# Patient Record
Sex: Female | Born: 1995 | Race: Black or African American | Hispanic: No | Marital: Single
Health system: Southern US, Community
[De-identification: ages and names within clinical notes are randomized; demographics above are authoritative.]

---
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
## 2020-06-11 ENCOUNTER — Other Ambulatory Visit: Payer: Self-pay

## 2020-06-11 ENCOUNTER — Emergency Department
Admission: EM | Admit: 2020-06-11 | Discharge: 2020-06-11 | Disposition: A | Discharge status: Home / Self Care | Payer: No Typology Code available for payment source | Attending: Emergency Medicine | Admitting: Emergency Medicine

## 2020-06-11 ENCOUNTER — Emergency Department: Payer: No Typology Code available for payment source

## 2020-06-11 DIAGNOSIS — L03211 Cellulitis of face: Secondary | ICD-10-CM | POA: Insufficient documentation

## 2020-06-11 DIAGNOSIS — R220 Localized swelling, mass and lump, head: Secondary | ICD-10-CM | POA: Diagnosis present

## 2020-06-11 LAB — CBC
HCT: 44.2 % (ref 36.0–46.0)
Hemoglobin: 14.5 g/dL (ref 12.0–15.0)
MCH: 28.5 pg (ref 26.0–34.0)
MCHC: 32.8 g/dL (ref 30.0–36.0)
MCV: 86.8 fL (ref 80.0–100.0)
Platelets: 253 10*3/uL (ref 150–400)
RBC: 5.09 MIL/uL (ref 3.87–5.11)
RDW: 13 % (ref 11.5–15.5)
WBC: 15.1 10*3/uL — ABNORMAL HIGH (ref 4.0–10.5)
nRBC: 0 % (ref 0.0–0.2)

## 2020-06-11 LAB — BASIC METABOLIC PANEL
Anion gap: 11 (ref 5–15)
BUN: 10 mg/dL (ref 6–20)
CO2: 24 mmol/L (ref 22–32)
Calcium: 9 mg/dL (ref 8.9–10.3)
Chloride: 103 mmol/L (ref 98–111)
Creatinine, Ser: 1.28 mg/dL — ABNORMAL HIGH (ref 0.44–1.00)
GFR, Estimated: >60 mL/min (ref >60)
Glucose, Bld: 108 mg/dL — ABNORMAL HIGH (ref 70–99)
Potassium: 3.5 mmol/L (ref 3.5–5.1)
Sodium: 138 mmol/L (ref 135–145)

## 2020-06-11 MED ORDER — DOXYCYCLINE HYCLATE 50 MG PO CAPS: 100.0000 mg | ORAL_CAPSULE | Freq: Two times a day (BID) | ORAL | 0 refills | Status: AC

## 2020-06-11 MED ORDER — DOXYCYCLINE HYCLATE 100 MG PO TABS
100.0000 mg | ORAL_TABLET | Freq: Once | ORAL | Status: AC
  Administered 2020-06-11: 100 mg via ORAL
  Filled 2020-06-11: qty 1

## 2020-06-11 NOTE — Discharge Instructions (Signed)
Use ice packs over this right eye for 10 minutes on and 10 minutes off over the next 48 hours for any worsening swelling or pain.  After 48 hours you can continue to use hot packs.  Additionally, you may use NSAIDs or Tylenol for any continued pain.

## 2020-06-11 NOTE — ED Notes (Signed)
Patient to and from CT scan, denies itching/pain to face, moderate swelling noted to right side of face, patient denies any issues speaking or swallowing. Vitals stable, tachycardic.

## 2020-06-11 NOTE — ED Notes (Signed)
EKG completed and given to provider for review. Provider and primary RN aware of pts heart rate. Pt on cardiac monitoring.
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
## 2020-06-11 NOTE — ED Notes (Signed)
Pt given work note, pt requesting workman's comp for ED visit. Pt does not know what is required regarding drug/alcohol testing. Charge RN looking into if profile is on file for pt's job. Pt states she does not have information for contacting her supervisor and supervisor not present at this time. Pt informed she will need to attempt to contact employer tonight or tomorrow morning and may need to return for testing. Pt verbalized understanding.

## 2020-07-01 ENCOUNTER — Emergency Department (HOSPITAL_COMMUNITY)
Admission: EM | Admit: 2020-07-01 | Discharge: 2020-07-01 | Disposition: A | Discharge status: Home / Self Care | Payer: Medicaid Other | Attending: Emergency Medicine | Admitting: Emergency Medicine

## 2020-07-01 ENCOUNTER — Encounter (HOSPITAL_COMMUNITY): Payer: Self-pay | Admitting: Emergency Medicine

## 2020-07-01 ENCOUNTER — Other Ambulatory Visit: Payer: Self-pay

## 2020-07-01 DIAGNOSIS — R112 Nausea with vomiting, unspecified: Secondary | ICD-10-CM | POA: Diagnosis not present

## 2020-07-01 DIAGNOSIS — R110 Nausea: Secondary | ICD-10-CM

## 2020-07-01 LAB — COMPREHENSIVE METABOLIC PANEL
ALT: 21 U/L (ref 0–44)
AST: 18 U/L (ref 15–41)
Albumin: 3.9 g/dL (ref 3.5–5.0)
Alkaline Phosphatase: 75 U/L (ref 38–126)
Anion gap: 9 (ref 5–15)
BUN: 9 mg/dL (ref 6–20)
CO2: 22 mmol/L (ref 22–32)
Calcium: 9.1 mg/dL (ref 8.9–10.3)
Chloride: 108 mmol/L (ref 98–111)
Creatinine, Ser: 1.27 mg/dL — ABNORMAL HIGH (ref 0.44–1.00)
GFR, Estimated: >60 mL/min (ref >60)
Glucose, Bld: 115 mg/dL — ABNORMAL HIGH (ref 70–99)
Potassium: 4.3 mmol/L (ref 3.5–5.1)
Sodium: 139 mmol/L (ref 135–145)
Total Bilirubin: 0.7 mg/dL (ref 0.3–1.2)
Total Protein: 7.7 g/dL (ref 6.5–8.1)

## 2020-07-01 LAB — URINALYSIS, ROUTINE W REFLEX MICROSCOPIC
Bacteria, UA: NONE SEEN
Bilirubin Urine: NEGATIVE
Glucose, UA: NEGATIVE mg/dL
Ketones, ur: NEGATIVE mg/dL
Leukocytes,Ua: NEGATIVE
Nitrite: NEGATIVE
Protein, ur: NEGATIVE mg/dL
Specific Gravity, Urine: 1 — ABNORMAL LOW (ref 1.005–1.030)
pH: 5 (ref 5.0–8.0)

## 2020-07-01 LAB — I-STAT BETA HCG BLOOD, ED (MC, WL, AP ONLY): I-stat hCG, quantitative: <5 m[IU]/mL (ref <5)

## 2020-07-01 LAB — CBC
HCT: 49.1 % — ABNORMAL HIGH (ref 36.0–46.0)
Hemoglobin: 15.6 g/dL — ABNORMAL HIGH (ref 12.0–15.0)
MCH: 28.3 pg (ref 26.0–34.0)
MCHC: 31.8 g/dL (ref 30.0–36.0)
MCV: 89.1 fL (ref 80.0–100.0)
Platelets: 282 10*3/uL (ref 150–400)
RBC: 5.51 MIL/uL — ABNORMAL HIGH (ref 3.87–5.11)
RDW: 13 % (ref 11.5–15.5)
WBC: 17.7 10*3/uL — ABNORMAL HIGH (ref 4.0–10.5)
nRBC: 0 % (ref 0.0–0.2)

## 2020-07-01 LAB — LIPASE, BLOOD: Lipase: 29 U/L (ref 11–51)

## 2020-07-01 MED ORDER — ONDANSETRON HCL 4 MG/2ML IJ SOLN
4.0000 mg | Freq: Once | INTRAMUSCULAR | Status: AC
  Administered 2020-07-01: 4 mg via INTRAVENOUS
  Filled 2020-07-01: qty 2

## 2020-07-01 MED ORDER — SODIUM CHLORIDE 0.9 % IV BOLUS
1000.0000 mL | Freq: Once | INTRAVENOUS | Status: AC
  Administered 2020-07-01: 1000 mL via INTRAVENOUS

## 2020-07-01 MED ORDER — ONDANSETRON HCL 4 MG PO TABS: 4.0000 mg | ORAL_TABLET | Freq: Four times a day (QID) | ORAL | 0 refills | Status: AC

## 2020-07-01 NOTE — Discharge Instructions (Signed)
Please return for any problem.  °

## 2020-07-01 NOTE — ED Provider Notes (Signed)
[place] EMERGENCY DEPARTMENT Provider Note   CSN: [id number] Arrival date & time: 07/01/20  1604     History Chief Complaint  Patient presents with  . Abdominal Pain    Sarah Sampson is a 25 y.o. female.  25 year old female with prior medical history detailed presents for evaluation of nausea and vomiting.  Patient reports that symptoms started yesterday evening after dinner.  She ate a big Mac for dinner.  She reports onset of nausea last night.  This morning she started vomiting.  She reports about 5 episodes of emesis over the course of today.  She denies diarrhea.  She denies abdominal pain.  She denies fever.  The history is provided by the patient and medical records.  Emesis Severity:  Mild Duration:  1 day Timing:  Rare Quality:  Stomach contents Progression:  Partially resolved Chronicity:  New Relieved by:  Nothing Worsened by:  Nothing      History reviewed. No pertinent past medical history.  There are no problems to display for this patient.   History reviewed. No pertinent surgical history.   OB History   No obstetric history on file.     No family history on file.     Home Medications Prior to Admission medications   Not on File    Allergies    Patient has no known allergies.  Review of Systems   Review of Systems  Gastrointestinal: Positive for vomiting.  All other systems reviewed and are negative.   Physical Exam Updated Vital Signs BP 118/81 (BP Location: Right Arm)   Pulse (!) 130   Temp 98.9 F (37.2 C)   Resp 18   SpO2 100%   Physical Exam Vitals and nursing note reviewed.  Constitutional:      General: She is not in acute distress.    Appearance: She is well-developed and well-nourished.  HENT:     Head: Normocephalic and atraumatic.     Mouth/Throat:     Mouth: Oropharynx is clear and moist.  Eyes:     Extraocular Movements: EOM normal.     Conjunctiva/sclera: Conjunctivae normal.      Pupils: Pupils are equal, round, and reactive to light.  Cardiovascular:     Rate and Rhythm: Normal rate and regular rhythm.     Heart sounds: Normal heart sounds.  Pulmonary:     Effort: Pulmonary effort is normal. No respiratory distress.     Breath sounds: Normal breath sounds.  Abdominal:     General: There is no distension.     Palpations: Abdomen is soft.     Tenderness: There is no abdominal tenderness.  Musculoskeletal:        General: No deformity or edema. Normal range of motion.     Cervical back: Normal range of motion and neck supple.  Skin:    General: Skin is warm and dry.  Neurological:     General: No focal deficit present.     Mental Status: She is alert and oriented to person, place, and time.  Psychiatric:        Mood and Affect: Mood and affect normal.     ED Results / Procedures / Treatments   Labs (all labs ordered are listed, but only abnormal results are displayed) Labs Reviewed  COMPREHENSIVE METABOLIC PANEL - Abnormal; Notable for the following components:      Result Value   Glucose, Bld 115 (*)    Creatinine, Ser 1.27 (*)  All other components within normal limits  CBC - Abnormal; Notable for the following components:   WBC 17.7 (*)    RBC 5.51 (*)    Hemoglobin 15.6 (*)    HCT 49.1 (*)    All other components within normal limits  URINALYSIS, ROUTINE W REFLEX MICROSCOPIC - Abnormal; Notable for the following components:   Color, Urine COLORLESS (*)    Specific Gravity, Urine 1.000 (*)    Hgb urine dipstick SMALL (*)    All other components within normal limits  LIPASE, BLOOD  I-STAT BETA HCG BLOOD, ED ([facility], [facility], [facility] ONLY)    EKG None  Radiology No results found.  Procedures Procedures   Medications Ordered in ED Medications  sodium chloride 0.9 % bolus 1,000 mL (has no administration in time range)  ondansetron (ZOFRAN) injection 4 mg (has no administration in time range)    ED Course  I have reviewed the triage vital signs  and the nursing notes.  Pertinent labs & imaging results that were available during my care of the patient were reviewed by me and considered in my medical decision making (see chart for details).    MDM Rules/Calculators/A&P                          MDM  Screen complete  Sarah Sampson was evaluated in Emergency Department on 07/01/2020 for the symptoms described in the history of present illness. She was evaluated in the context of the global COVID-19 pandemic, which necessitated consideration that the patient might be at risk for infection with the SARS-CoV-2 virus that causes COVID-19. Institutional protocols and algorithms that pertain to the evaluation of patients at risk for COVID-19 are in a state of rapid change based on information released by regulatory bodies including the CDC and federal and state organizations. These policies and algorithms were followed during the patient's care in the ED.  Patient is presenting for reported nausea and vomiting.  Patient's abdominal exam is benign.  Screening labs are without significant abnormality.  Patient given IV fluids and antiemetics.  Patient is now tolerating p.o.  Repeat abdominal exam remains benign.  Patient desires discharge.  Importance of close follow-up is stressed.  Strict return precautions given and understood.  Final Clinical Impression(s) / ED Diagnoses Final diagnoses:  Nausea  Nausea and vomiting, intractability of vomiting not specified, unspecified vomiting type    Rx / DC Orders ED Discharge Orders         Ordered    ondansetron (ZOFRAN) 4 MG tablet  Every 6 hours        07/01/20 2221           Valarie Merino, MD 07/01/20 2303

## 2020-07-01 NOTE — ED Triage Notes (Signed)
Patient complains of abdominal pain that started this morning after she states she ate a funny tasting Big Mac and then vomited. Patient alert, oriented, and in no apparent distress at this time.

## 2020-07-03 ENCOUNTER — Telehealth: Payer: Self-pay | Admitting: Surgery

## 2020-07-03 NOTE — Telephone Encounter (Signed)
ED RNCM received call from [facility] pharmacy with prescription clarification. Clarification provided, and ED RNCM received call from [facility] pharmacy with prescription clarification. Clarification provided no further questions or concerns noted. no further questions or concerns noted.

## 2022-09-30 ENCOUNTER — Other Ambulatory Visit: Payer: Medicaid Other

## 2022-10-07 ENCOUNTER — Other Ambulatory Visit: Payer: Self-pay

## 2022-10-07 ENCOUNTER — Ambulatory Visit (LOCAL_COMMUNITY_HEALTH_CENTER): Payer: Self-pay

## 2022-10-07 DIAGNOSIS — Z111 Encounter for screening for respiratory tuberculosis: Secondary | ICD-10-CM

## 2022-10-10 ENCOUNTER — Ambulatory Visit (LOCAL_COMMUNITY_HEALTH_CENTER): Payer: Self-pay

## 2022-10-10 DIAGNOSIS — Z111 Encounter for screening for respiratory tuberculosis: Secondary | ICD-10-CM

## 2022-10-10 LAB — TB SKIN TEST
Induration: 0 mm
TB Skin Test: NEGATIVE
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
